# Patient Record
Sex: Male | Born: 1975 | Race: Black or African American | Hispanic: No | Marital: Married | Smoking: Former smoker
Health system: Southern US, Community
[De-identification: ages and names within clinical notes are randomized; demographics above are authoritative.]

## PROBLEM LIST (undated history)

## (undated) DIAGNOSIS — R519 Headache, unspecified: Secondary | ICD-10-CM

## (undated) DIAGNOSIS — I10 Essential (primary) hypertension: Secondary | ICD-10-CM

## (undated) DIAGNOSIS — R51 Headache: Secondary | ICD-10-CM

## (undated) HISTORY — DX: Headache: R51

## (undated) HISTORY — PX: WRIST SURGERY: SHX841

## (undated) HISTORY — DX: Headache, unspecified: R51.9

---

## 1999-02-25 ENCOUNTER — Emergency Department (HOSPITAL_COMMUNITY): Admission: EM | Admit: 1999-02-25 | Discharge: 1999-02-25 | Payer: Self-pay | Admitting: Emergency Medicine

## 1999-06-15 ENCOUNTER — Encounter: Payer: Self-pay | Admitting: Emergency Medicine

## 1999-06-15 ENCOUNTER — Emergency Department (HOSPITAL_COMMUNITY): Admission: EM | Admit: 1999-06-15 | Discharge: 1999-06-15 | Payer: Self-pay | Admitting: Emergency Medicine

## 1999-06-19 ENCOUNTER — Emergency Department (HOSPITAL_COMMUNITY): Admission: EM | Admit: 1999-06-19 | Discharge: 1999-06-19 | Payer: Self-pay | Admitting: Emergency Medicine

## 1999-06-19 ENCOUNTER — Encounter: Payer: Self-pay | Admitting: Emergency Medicine

## 1999-06-26 ENCOUNTER — Emergency Department (HOSPITAL_COMMUNITY): Admission: EM | Admit: 1999-06-26 | Discharge: 1999-06-26 | Payer: Self-pay | Admitting: Emergency Medicine

## 2001-04-11 ENCOUNTER — Encounter: Payer: Self-pay | Admitting: Emergency Medicine

## 2001-04-11 ENCOUNTER — Emergency Department (HOSPITAL_COMMUNITY): Admission: EM | Admit: 2001-04-11 | Discharge: 2001-04-11 | Payer: Self-pay | Admitting: Emergency Medicine

## 2001-08-11 ENCOUNTER — Encounter: Payer: Self-pay | Admitting: Emergency Medicine

## 2001-08-11 ENCOUNTER — Emergency Department (HOSPITAL_COMMUNITY): Admission: EM | Admit: 2001-08-11 | Discharge: 2001-08-11 | Payer: Self-pay | Admitting: Emergency Medicine

## 2001-08-29 ENCOUNTER — Emergency Department (HOSPITAL_COMMUNITY): Admission: EM | Admit: 2001-08-29 | Discharge: 2001-08-29 | Payer: Self-pay | Admitting: Emergency Medicine

## 2001-08-29 ENCOUNTER — Encounter: Payer: Self-pay | Admitting: Emergency Medicine

## 2002-01-11 ENCOUNTER — Emergency Department (HOSPITAL_COMMUNITY): Admission: AC | Admit: 2002-01-11 | Discharge: 2002-01-11 | Payer: Self-pay

## 2002-01-11 ENCOUNTER — Encounter: Payer: Self-pay | Admitting: Emergency Medicine

## 2005-03-23 ENCOUNTER — Emergency Department (HOSPITAL_COMMUNITY): Admission: EM | Admit: 2005-03-23 | Discharge: 2005-03-23 | Payer: Self-pay | Admitting: Emergency Medicine

## 2005-04-26 ENCOUNTER — Emergency Department (HOSPITAL_COMMUNITY): Admission: EM | Admit: 2005-04-26 | Discharge: 2005-04-26 | Payer: Self-pay | Admitting: Emergency Medicine

## 2006-04-11 ENCOUNTER — Emergency Department (HOSPITAL_COMMUNITY): Admission: EM | Admit: 2006-04-11 | Discharge: 2006-04-11 | Payer: Self-pay | Admitting: Emergency Medicine

## 2006-04-17 ENCOUNTER — Ambulatory Visit (HOSPITAL_COMMUNITY): Admission: RE | Admit: 2006-04-17 | Discharge: 2006-04-18 | Payer: Self-pay | Admitting: Orthopaedic Surgery

## 2006-05-24 ENCOUNTER — Encounter: Admission: RE | Admit: 2006-05-24 | Discharge: 2006-08-22 | Payer: Self-pay | Admitting: Orthopaedic Surgery

## 2007-01-21 ENCOUNTER — Ambulatory Visit: Payer: Self-pay | Admitting: Internal Medicine

## 2007-02-13 ENCOUNTER — Ambulatory Visit: Payer: Self-pay | Admitting: Internal Medicine

## 2007-02-13 ENCOUNTER — Encounter (INDEPENDENT_AMBULATORY_CARE_PROVIDER_SITE_OTHER): Payer: Self-pay | Admitting: *Deleted

## 2007-03-09 ENCOUNTER — Emergency Department (HOSPITAL_COMMUNITY): Admission: EM | Admit: 2007-03-09 | Discharge: 2007-03-09 | Payer: Self-pay | Admitting: Emergency Medicine
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
## 2007-08-18 ENCOUNTER — Emergency Department (HOSPITAL_COMMUNITY): Admission: EM | Admit: 2007-08-18 | Discharge: 2007-08-18 | Payer: Self-pay | Admitting: Emergency Medicine

## 2008-01-16 ENCOUNTER — Emergency Department (HOSPITAL_COMMUNITY): Admission: EM | Admit: 2008-01-16 | Discharge: 2008-01-17 | Payer: Self-pay | Admitting: Emergency Medicine

## 2008-05-15 ENCOUNTER — Emergency Department (HOSPITAL_COMMUNITY): Admission: EM | Admit: 2008-05-15 | Discharge: 2008-05-15 | Payer: Self-pay | Admitting: Emergency Medicine

## 2008-10-11 ENCOUNTER — Encounter: Admission: RE | Admit: 2008-10-11 | Discharge: 2008-10-11 | Payer: Self-pay | Admitting: Family Medicine

## 2010-04-04 ENCOUNTER — Emergency Department (HOSPITAL_COMMUNITY): Admission: EM | Admit: 2010-04-04 | Discharge: 2010-04-04 | Payer: Self-pay | Admitting: Emergency Medicine

## 2010-06-13 ENCOUNTER — Emergency Department (HOSPITAL_COMMUNITY): Admission: EM | Admit: 2010-06-13 | Discharge: 2010-06-13 | Payer: Self-pay | Admitting: Emergency Medicine

## 2011-02-01 ENCOUNTER — Encounter (HOSPITAL_COMMUNITY): Payer: Self-pay | Admitting: Radiology

## 2011-02-01 ENCOUNTER — Inpatient Hospital Stay (HOSPITAL_COMMUNITY)
Admission: EM | Admit: 2011-02-01 | Discharge: 2011-02-02 | DRG: 392 | Disposition: A | Discharge status: Home / Self Care | Payer: Self-pay | Attending: Internal Medicine | Admitting: Internal Medicine

## 2011-02-01 ENCOUNTER — Emergency Department (HOSPITAL_COMMUNITY): Payer: Self-pay

## 2011-02-01 DIAGNOSIS — A09 Infectious gastroenteritis and colitis, unspecified: Principal | ICD-10-CM | POA: Diagnosis present

## 2011-02-01 DIAGNOSIS — G43909 Migraine, unspecified, not intractable, without status migrainosus: Secondary | ICD-10-CM | POA: Diagnosis present

## 2011-02-01 DIAGNOSIS — R197 Diarrhea, unspecified: Secondary | ICD-10-CM

## 2011-02-01 DIAGNOSIS — F1210 Cannabis abuse, uncomplicated: Secondary | ICD-10-CM | POA: Diagnosis present

## 2011-02-01 DIAGNOSIS — R1031 Right lower quadrant pain: Secondary | ICD-10-CM

## 2011-02-01 DIAGNOSIS — K219 Gastro-esophageal reflux disease without esophagitis: Secondary | ICD-10-CM | POA: Diagnosis present

## 2011-02-01 DIAGNOSIS — F17200 Nicotine dependence, unspecified, uncomplicated: Secondary | ICD-10-CM | POA: Diagnosis present

## 2011-02-01 DIAGNOSIS — R933 Abnormal findings on diagnostic imaging of other parts of digestive tract: Secondary | ICD-10-CM

## 2011-02-01 LAB — COMPREHENSIVE METABOLIC PANEL
ALT: 23 U/L (ref 0–53)
Albumin: 3.8 g/dL (ref 3.5–5.2)
Alkaline Phosphatase: 80 U/L (ref 39–117)
BUN: 6 mg/dL (ref 6–23)
CO2: 25 mEq/L (ref 19–32)
GFR calc Af Amer: >60 mL/min (ref >60)
Glucose, Bld: 88 mg/dL (ref 70–99)
Sodium: 140 mEq/L (ref 135–145)

## 2011-02-01 LAB — DIFFERENTIAL
Basophils Absolute: 0 10*3/uL (ref 0.0–0.1)
Eosinophils Absolute: 0.2 10*3/uL (ref 0.0–0.7)
Eosinophils Relative: 2 % (ref 0–5)
Lymphs Abs: 2.8 10*3/uL (ref 0.7–4.0)
Monocytes Absolute: 0.8 10*3/uL (ref 0.1–1.0)
Monocytes Relative: 9 % (ref 3–12)
Neutrophils Relative %: 56 % (ref 43–77)

## 2011-02-01 LAB — CBC
HCT: 41 % (ref 39.0–52.0)
Hemoglobin: 13.9 g/dL (ref 13.0–17.0)
MCHC: 33.9 g/dL (ref 30.0–36.0)
Platelets: 241 10*3/uL (ref 150–400)
RBC: 4.42 MIL/uL (ref 4.22–5.81)
WBC: 8.5 10*3/uL (ref 4.0–10.5)

## 2011-02-01 LAB — URINALYSIS, ROUTINE W REFLEX MICROSCOPIC
Bilirubin Urine: NEGATIVE
Glucose, UA: NEGATIVE mg/dL
Ketones, ur: NEGATIVE mg/dL
Leukocytes, UA: NEGATIVE
Protein, ur: NEGATIVE mg/dL
Protein, ur: NEGATIVE mg/dL
Specific Gravity, Urine: 1.012 (ref 1.005–1.030)
Urobilinogen, UA: 0.2 mg/dL (ref 0.0–1.0)

## 2011-02-01 LAB — MRSA PCR SCREENING: MRSA by PCR: NEGATIVE

## 2011-02-01 LAB — URINE MICROSCOPIC-ADD ON

## 2011-02-01 MED ORDER — IOHEXOL 300 MG/ML  SOLN
100.0000 mL | Freq: Once | INTRAMUSCULAR | Status: AC | PRN
  Administered 2011-02-01: 100 mL via INTRAVENOUS

## 2011-02-02 LAB — COMPREHENSIVE METABOLIC PANEL
ALT: 20 U/L (ref 0–53)
Alkaline Phosphatase: 69 U/L (ref 39–117)
Chloride: 108 mEq/L (ref 96–112)
GFR calc Af Amer: >60 mL/min (ref >60)
GFR calc non Af Amer: >60 mL/min (ref >60)
Glucose, Bld: 134 mg/dL — ABNORMAL HIGH (ref 70–99)
Potassium: 4.5 mEq/L (ref 3.5–5.1)
Total Bilirubin: 0.5 mg/dL (ref 0.3–1.2)

## 2011-02-02 LAB — CBC
HCT: 40.8 % (ref 39.0–52.0)
MCH: 31.5 pg (ref 26.0–34.0)
MCHC: 33.8 g/dL (ref 30.0–36.0)
MCV: 93.2 fL (ref 78.0–100.0)
Platelets: 257 10*3/uL (ref 150–400)
RDW: 13 % (ref 11.5–15.5)
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
## 2011-03-03 NOTE — Discharge Summary (Signed)
Dylan Mccullough, Dylan Mccullough                    ACCOUNT NO.:  [id number]  MEDICAL RECORD NO.:  [id number]           PATIENT TYPE:  I  LOCATION:  1507                         FACILITY:  [facility]  PHYSICIAN:  Kathlen Mody, MD       DATE OF BIRTH:  Jun 06, 1976  DATE OF ADMISSION:  02/01/2011 DATE OF DISCHARGE:  02/02/2011                              DISCHARGE SUMMARY   DISCHARGE DIAGNOSES: 1. Gastroenteritis. 2. Possible inflammatory bowel disease. 3. Gastroesophageal reflux disease. 4. Migraine. 5. Marijuana abuse.  DISCHARGE MEDICATIONS: 1. Tylenol 650 mg every 4 hours as needed. 2. Ciprofloxacin 500 mg twice daily for 7 days. 3. Flagyl 250 mg 3 times a day to take for 7 days. 4. Nicotine patch daily. 5. Zofran 4 mg q.6 hours as needed. 6. Protonix 40 mg 1 tab daily. 7. Multivitamin 1 tab twice daily.  CONSULTATIONS CALLED:  Gastroenterology consult from [facility].  PROCEDURES DONE:  None.  PERTINENT LABORATORY DATA:  On admission, the patient a CBC done which was within normal limits, comprehensive metabolic panel which was also within normal limits.  Urinalysis was negative for nitrites and leukocytes.  An MRSA screening was negative.  Stool occult was negative.  RADIOLOGY:  The patient had a CT abdomen and pelvis with contrast, showed marked mucosal edema of the distal and terminal ileum, consistent with IBD, probably Crohn disease.  No complicated features are evident.  BRIEF HOSPITAL COURSE:  This 35 year old male with a history of tobacco and marijuana abuse, history of GERD, admitted with abdominal pain and increased bowel movements and a little bit of nausea and vomiting.  The patient, on coming to the ER, had a CT abdomen and pelvis done, which showed marked mucosal edema in the distal part of the ileum, consistent with IBD, at which point he was started on IV antibiotics, IV Cipro and IV Flagyl.  He was also started on IV fluids.  Gastroenterology consult was called.  The  patient was previously seen by [facility] and [facility] was consulted who recommended advancing the diet as tolerated and felt that the patient's symptoms do not look like consistent with IBD. Over the next 24 hours, the patient's symptoms have improved dramatically.  The next day, the patient did not have any nausea, vomiting, abdominal pain, or diarrhea.  He insisted he wanted to go home.  Spoke with Gastroenterology who recommended changing the antibiotics to p.o. antibiotics and advancing his diet to regular diet. The patient tolerated regular diet over the next few hours.  The patient was discharged on p.o. antibiotics for about 1 week.  PHYSICAL EXAMINATION:  VITAL SIGNS:  On the day of discharge, the patient's vitals improved, temperature of 97.9, pulse of 78, respirations 17, blood pressure of 134/75, and saturating 93% on room air. GENERAL:  On exam, he is alert, afebrile, comfortable, oriented x3. CARDIOVASCULAR:  S1, S2 heard. RESPIRATORY:  Good air entry bilaterally. ABDOMEN:  Soft, nontender, nondistended.  Bowel sounds are heard. EXTREMITIES:  No pedal edema.  The patient was hemodynamically stable for discharge and he was discharged on 7 days of  p.o. antibiotics for possible gastroenteritis as the patient's wife had similar complaints about few days ago.  FOLLOWUP:  The patient is recommended to follow up with PCP in about 1 week and to follow up with [facility] in about 1-2 weeks for further workup.          ______________________________ Kathlen Mody, MD     VA/MEDQ  D:  03/01/2011  T:  03/02/2011  Job:  [id number]  Electronically Signed by Kathlen Mody MD on 03/03/2011 02:17:19 PM

## 2011-03-30 NOTE — Assessment & Plan Note (Signed)
[facility]                         GASTROENTEROLOGY OFFICE NOTE   YASSER, HEPP                           MRN:          [id number]  DATE:01/21/2007                            DOB:          04-07-1976    Mr. Rieger is a very nice 35 year old gentleman who comes with severe  heartburn of 3-4 years duration. He is a single parent having 3 children  and working second shift from 5 to midnight. He often comes home at  12:00 and eats a large meal, eats between 12 and 1:00 in the morning and  he goes directly to bed and usually wake up at 3-6 in the morning with  severe heartburn. He denies any dysphagia, nocturnal cough or  hoarseness. He takes Tums and Rolaids but has not tried any proton pump  inhibitor. He continues to smoke but does not drink any alcohol or  caffeine. He denies taking NSAIDs or aspirin. He has had some  intentional weight loss from 255 to 230 pounds. He has been eating out a  lot being rather stressed out.   MEDICATIONS:  None.   PAST MEDICAL HISTORY:  Significant for wrist surgery, no other  operations. No family history of colon cancer.   FAMILY HISTORY:  Significant for heart disease in his mother who had a  coronary artery bypass graft.   SOCIAL HISTORY:  Single with 4 children. He works as a Financial risk analyst. He smokes  and drinks alcohol socially.   REVIEW OF SYSTEMS:  Positive for skin rash, back pain, heart problems.   PHYSICAL EXAMINATION:  VITAL SIGNS:  Blood pressure 140/76, pulse 100  and weight 232 pounds.  GENERAL:  He was alert and oriented in no distress.  LUNGS:  Clear to auscultation.  COR:  Normal S1, normal S2.  ABDOMEN:  Soft, nontender with normal active bowel sounds.  EXTREMITIES:  No edema.   IMPRESSION:  A 35 year old African-American male with severe heartburn  of 3-4 years duration most likely aggravated by his lifestyle, working  late at night and eating late. Also smoking may be a contributing  factor. Rule  out Barrett's esophagus. Clinically he does not have any  dysphagia or signs of aspiration.   PLAN:  1. Booklet on gastroesophageal reflux which includes head of the bed      elevation, modifying      his eating habits and lifestyle.  2. Upper endoscopy scheduled.  3. Nexium 40 mg daily. He takes 2 today and then 1 at 8 p.m. every      day.     Hedwig Morton. Juanda Chance, MD  Electronically Signed    DMB/MedQ  DD: 01/21/2007  DT: 01/23/2007  Job #: [id number]
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
## 2011-08-23 LAB — GC/CHLAMYDIA PROBE AMP, GENITAL: Chlamydia, DNA Probe: NEGATIVE

## 2011-09-13 ENCOUNTER — Emergency Department (HOSPITAL_COMMUNITY)
Admission: EM | Admit: 2011-09-13 | Discharge: 2011-09-14 | Disposition: A | Discharge status: Home / Self Care | Payer: Self-pay | Attending: Emergency Medicine | Admitting: Emergency Medicine

## 2011-09-13 DIAGNOSIS — K625 Hemorrhage of anus and rectum: Secondary | ICD-10-CM | POA: Insufficient documentation

## 2011-09-13 LAB — POCT I-STAT, CHEM 8
Calcium, Ion: 1.15 mmol/L (ref 1.12–1.32)
Chloride: 108 mEq/L (ref 96–112)
Creatinine, Ser: 0.9 mg/dL (ref 0.50–1.35)
Glucose, Bld: 105 mg/dL — ABNORMAL HIGH (ref 70–99)
HCT: 39 % (ref 39.0–52.0)
Sodium: 141 mEq/L (ref 135–145)

## 2011-09-13 LAB — OCCULT BLOOD, POC DEVICE: Fecal Occult Bld: POSITIVE

## 2012-07-02 ENCOUNTER — Encounter (HOSPITAL_COMMUNITY): Payer: Self-pay | Admitting: *Deleted

## 2012-07-02 ENCOUNTER — Emergency Department (HOSPITAL_COMMUNITY)
Admission: EM | Admit: 2012-07-02 | Discharge: 2012-07-02 | Disposition: A | Discharge status: Home / Self Care | Payer: Self-pay | Attending: Emergency Medicine | Admitting: Emergency Medicine

## 2012-07-02 DIAGNOSIS — Z2089 Contact with and (suspected) exposure to other communicable diseases: Secondary | ICD-10-CM | POA: Insufficient documentation

## 2012-07-02 MED ORDER — PERMETHRIN 5 % EX CREA: TOPICAL_CREAM | CUTANEOUS | Status: AC

## 2012-07-02 NOTE — ED Provider Notes (Addendum)
History     CSN: [id number]  Arrival date & time 07/02/12  1800   First MD Initiated Contact with Patient 07/02/12 1803      Chief Complaint  Patient presents with  . scabies exposure     (Consider location/radiation/quality/duration/timing/severity/associated sxs/prior treatment) HPI Comments: Patient presents complaining of scabies exposure. Exposure was from a friend's child who was staying in his home. He as well as his wife and 3 children have been itching. He does not have a rash and has no other complaints. No pain or other areas of redness or drainage.  The history is provided by the patient.    History reviewed. No pertinent past medical history.  History reviewed. No pertinent past surgical history.  No family history on file.  History  Substance Use Topics  . Smoking status: Not on file  . Smokeless tobacco: Not on file  . Alcohol Use: Not on file      Review of Systems  All other systems reviewed and are negative.    Allergies  Review of patient's allergies indicates no known allergies.  Home Medications  No current outpatient prescriptions on file.  BP 146/94  Pulse 90  Temp 98.6 F (37 C) (Oral)  SpO2 100%  Physical Exam  Constitutional: He is oriented to person, place, and time. He appears well-developed and well-nourished. No distress.  HENT:  Head: Atraumatic.  Eyes: Pupils are equal, round, and reactive to light.  Musculoskeletal: He exhibits no edema and no tenderness.  Neurological: He is alert and oriented to person, place, and time.  Skin: Skin is warm and dry. No rash noted. No erythema.    ED Course  Procedures (including critical care time)  Labs Reviewed - No data to display No results found.   1. Scabies exposure       MDM           Gwyneth Sprout, MD 07/02/12 4098  Gwyneth Sprout, MD 07/02/12 1816

## 2012-07-02 NOTE — ED Notes (Signed)
Here for scabies exposure

## 2013-09-27 ENCOUNTER — Emergency Department (HOSPITAL_COMMUNITY): Payer: No Typology Code available for payment source

## 2013-09-27 ENCOUNTER — Encounter (HOSPITAL_COMMUNITY): Payer: Self-pay | Admitting: Emergency Medicine

## 2013-09-27 ENCOUNTER — Emergency Department (HOSPITAL_COMMUNITY)
Admission: EM | Admit: 2013-09-27 | Discharge: 2013-09-27 | Disposition: A | Discharge status: Home / Self Care | Payer: No Typology Code available for payment source | Attending: Emergency Medicine | Admitting: Emergency Medicine

## 2013-09-27 DIAGNOSIS — S62308A Unspecified fracture of other metacarpal bone, initial encounter for closed fracture: Secondary | ICD-10-CM

## 2013-09-27 DIAGNOSIS — IMO0002 Reserved for concepts with insufficient information to code with codable children: Secondary | ICD-10-CM | POA: Insufficient documentation

## 2013-09-27 DIAGNOSIS — S62319A Displaced fracture of base of unspecified metacarpal bone, initial encounter for closed fracture: Secondary | ICD-10-CM | POA: Insufficient documentation

## 2013-09-27 DIAGNOSIS — Y939 Activity, unspecified: Secondary | ICD-10-CM | POA: Insufficient documentation

## 2013-09-27 DIAGNOSIS — Z87891 Personal history of nicotine dependence: Secondary | ICD-10-CM | POA: Insufficient documentation

## 2013-09-27 DIAGNOSIS — Y929 Unspecified place or not applicable: Secondary | ICD-10-CM | POA: Insufficient documentation

## 2013-09-27 MED ORDER — HYDROCODONE-ACETAMINOPHEN 5-325 MG PO TABS: 1.0000 | ORAL_TABLET | ORAL | Status: DC | PRN

## 2013-09-27 MED ORDER — HYDROCODONE-ACETAMINOPHEN 5-325 MG PO TABS
1.0000 | ORAL_TABLET | Freq: Once | ORAL | Status: AC
  Administered 2013-09-27: 1 via ORAL
  Filled 2013-09-27: qty 1

## 2013-09-27 NOTE — ED Notes (Signed)
Pt given d/c instructions and verbalized understanding. NAD at this time. Pts wife is driving home.

## 2013-09-27 NOTE — Progress Notes (Signed)
Orthopedic Tech Progress Note Patient Details:  Dylan Mccullough Apr 03, 1976 [id number]  Ortho Devices Type of Ortho Device: Ace wrap;Arm sling;Ulna gutter splint Ortho Device/Splint Location: RUE Ortho Device/Splint Interventions: Ordered;Application   Jennye Moccasin 09/27/2013, 8:45 PM

## 2013-09-27 NOTE — ED Provider Notes (Signed)
CSN: [id number]     Arrival date & time 09/27/13  1859 History   First MD Initiated Contact with Patient 09/27/13 1911     This chart was scribed for non-physician practitioner, Teressa Lower, NP working with Hilario Quarry, MD by Arlan Organ, ED Scribe. This patient was seen in room TR08C/TR08C and the patient's care was started at 7:41 PM.   Chief Complaint  Patient presents with  . Hand Injury   The history is provided by the patient. No language interpreter was used.   HPI Comments: Dylan Mccullough is a 37 y.o. male who presents to the Emergency Department complaining of a right hand injury that occurred earlier today. Pt states he jammed his finger when hitting a wall. He also reports associated swelling to his right knuckle. He says he has not taken anything OTC for discomfort. Pt states he ate just prior to arrival. He denies any known allergies.  History reviewed. No pertinent past medical history. Past Surgical History  Procedure Laterality Date  . Wrist surgery      right   History reviewed. No pertinent family history. History  Substance Use Topics  . Smoking status: Former Games developer  . Smokeless tobacco: Not on file  . Alcohol Use: No    Review of Systems  Musculoskeletal: Positive for arthralgias (hand injury) and joint swelling.  All other systems reviewed and are negative.    Allergies  Review of patient's allergies indicates no known allergies.  Home Medications  No current outpatient prescriptions on file.  Triage Vitals: BP 155/98  Pulse 97  Temp(Src) 98.7 F (37.1 C) (Oral)  Resp 16  Ht 5\' 11"  (1.803 m)  Wt 225 lb (102.059 kg)  BMI 31.39 kg/m2  SpO2 99%  Physical Exam  Nursing note and vitals reviewed. Constitutional: He is oriented to person, place, and time. He appears well-developed and well-nourished.  HENT:  Head: Normocephalic and atraumatic.  Eyes: EOM are normal.  Neck: Normal range of motion.  Cardiovascular: Normal rate.    Pulmonary/Chest: Effort normal.  Musculoskeletal: Normal range of motion. He exhibits edema and tenderness.  Obvious swelling to area of 4th metacarpal of the right hand:cap refill <3sec  Neurological: He is alert and oriented to person, place, and time.  Skin: Skin is warm and dry.  Psychiatric: He has a normal mood and affect. His behavior is normal.    ED Course  Procedures (including critical care time)  DIAGNOSTIC STUDIES: Oxygen Saturation is 99% on RA, Normal by my interpretation.    COORDINATION OF CARE: 7:45 PM- Will order X-Ray and splint. Discussed treatment plan with pt at bedside and pt agreed to plan.     Labs Review Labs Reviewed - No data to display Imaging Review Dg Hand Complete Right  09/27/2013   CLINICAL DATA:  Punched a wall today with obvious swelling involving the medial aspect of the right hand, history of prior wrist fracture  EXAM: RIGHT HAND - COMPLETE 3+ VIEW  COMPARISON:  None.  FINDINGS: There is a slightly comminuted and impacted fracture involving the head of the 4th metacarpal, apex ventral with associated adjacent soft tissue swelling. No definite intra-articular extension. No dislocation.  Post ORIF of the distal radius which is incompletely evaluated though without definitive evidence of hardware failure or loosening. Advanced degenerative changes of the radiocarpal joint is are suspected. Joint spaces are preserved. No erosions.  IMPRESSION: 1. Acute slightly comminuted and impacted fracture of the head of the 4th metacarpal without  definitive intra-articular extension. 2. Sequela of prior ORIF of the distal radius with associated advanced degenerative changes of the radiocarpal joint, incompletely evaluated.   Electronically Signed   By: Simonne Come M.D.   On: 09/27/2013 20:01    EKG Interpretation   None       MDM   1. Fracture of fourth metacarpal bone, closed, initial encounter    Pt is okay to follow up with DR. Magnus Ivan who has been  seen in the past:pt unable to full extend finger:neurovascularly intact  I personally performed the services described in this documentation, which was scribed in my presence. The recorded information has been reviewed and is accurate.    Teressa Lower, NP 09/27/13 2052

## 2013-09-27 NOTE — ED Notes (Signed)
Pt states that he jammed his finger this evening. Pt has a swollen knuckle in his right hand below his third finger. Pt able to wiggle finger and cns intact.

## 2013-09-27 NOTE — ED Provider Notes (Signed)
History/physical exam/procedure(s) were performed by non-physician practitioner and as supervising physician I was immediately available for consultation/collaboration. I have reviewed all notes and am in agreement with care and plan.   Dorethy Tomey S Sundai Probert, MD 09/27/13 2247 

## 2015-01-23 ENCOUNTER — Encounter (HOSPITAL_COMMUNITY): Payer: Self-pay | Admitting: Emergency Medicine

## 2015-01-23 ENCOUNTER — Emergency Department (HOSPITAL_COMMUNITY)
Admission: EM | Admit: 2015-01-23 | Discharge: 2015-01-23 | Disposition: A | Discharge status: Home / Self Care | Payer: BLUE CROSS/BLUE SHIELD | Attending: Emergency Medicine | Admitting: Emergency Medicine

## 2015-01-23 DIAGNOSIS — R519 Headache, unspecified: Secondary | ICD-10-CM

## 2015-01-23 DIAGNOSIS — R51 Headache: Secondary | ICD-10-CM | POA: Diagnosis not present

## 2015-01-23 DIAGNOSIS — I10 Essential (primary) hypertension: Secondary | ICD-10-CM | POA: Insufficient documentation

## 2015-01-23 DIAGNOSIS — Z720 Tobacco use: Secondary | ICD-10-CM | POA: Insufficient documentation

## 2015-01-23 DIAGNOSIS — H53149 Visual discomfort, unspecified: Secondary | ICD-10-CM | POA: Insufficient documentation

## 2015-01-23 HISTORY — DX: Essential (primary) hypertension: I10

## 2015-01-23 LAB — CBC
HEMATOCRIT: 39.2 % (ref 39.0–52.0)
Hemoglobin: 12.9 g/dL — ABNORMAL LOW (ref 13.0–17.0)
MCH: 30.2 pg (ref 26.0–34.0)
MCHC: 32.9 g/dL (ref 30.0–36.0)
MCV: 91.8 fL (ref 78.0–100.0)
Platelets: 281 10*3/uL (ref 150–400)
RBC: 4.27 MIL/uL (ref 4.22–5.81)
RDW: 14.7 % (ref 11.5–15.5)
WBC: 8.9 10*3/uL (ref 4.0–10.5)

## 2015-01-23 LAB — I-STAT CHEM 8, ED
BUN: 13 mg/dL (ref 6–23)
Calcium, Ion: 1.09 mmol/L — ABNORMAL LOW (ref 1.12–1.23)
Chloride: 105 mmol/L (ref 96–112)
Creatinine, Ser: 1 mg/dL (ref 0.50–1.35)
Glucose, Bld: 116 mg/dL — ABNORMAL HIGH (ref 70–99)
HCT: 43 % (ref 39.0–52.0)
Hemoglobin: 14.6 g/dL (ref 13.0–17.0)
Potassium: 3.8 mmol/L (ref 3.5–5.1)
SODIUM: 140 mmol/L (ref 135–145)
TCO2: 20 mmol/L (ref 0–100)

## 2015-01-23 MED ORDER — DIPHENHYDRAMINE HCL 50 MG/ML IJ SOLN: 25.0000 mg | Freq: Once | INTRAMUSCULAR | Status: DC

## 2015-01-23 MED ORDER — KETOROLAC TROMETHAMINE 30 MG/ML IJ SOLN: 30.0000 mg | Freq: Once | INTRAMUSCULAR | Status: DC

## 2015-01-23 MED ORDER — METOCLOPRAMIDE HCL 5 MG/ML IJ SOLN: 10.0000 mg | Freq: Once | INTRAMUSCULAR | Status: DC

## 2015-01-23 MED ORDER — SODIUM CHLORIDE 0.9 % IV BOLUS (SEPSIS): 1000.0000 mL | Freq: Once | INTRAVENOUS | Status: DC

## 2015-01-23 NOTE — ED Notes (Signed)
Wheel chair at bedside.

## 2015-01-23 NOTE — ED Notes (Signed)
Patient presents with a HA 7/10 right sided. States the pain has been ongoing for four weeks. He has seen a PCP for this and has a follow up appointment in two weeks. States he was prescribed HTN medication and the pain became too much and needs pain treatment.

## 2015-01-23 NOTE — ED Provider Notes (Signed)
CSN: [id number]     Arrival date & time 01/23/15  0351 History   First MD Initiated Contact with Patient 01/23/15 [id number]     Chief Complaint  Patient presents with  . Headache     (Consider location/radiation/quality/duration/timing/severity/associated sxs/prior Treatment) HPI  Dylan Mccullough is a 39 y.o. male with PMH of particular, headaches presenting with intermittent headache for the last 4 weeks. He describes the pressure on the right side actually behind the eyes. Patient presented to his primary care provider and was given beta blocker and states he has not had any improvement. Has not taken anything else for his symptoms. He endorses associated photophobia but no nausea or vomiting. No visual changes, slurred speech, numbness, tingling, weakness. No fevers chills or neck pain. He states is like other headaches he's had before with the last one in 2010. He does endorse lacrimation salivation.   Past Medical History  Diagnosis Date  . Hypertension    Past Surgical History  Procedure Laterality Date  . Wrist surgery      right   History reviewed. No pertinent family history. History  Substance Use Topics  . Smoking status: Current Some Day Smoker  . Smokeless tobacco: Not on file  . Alcohol Use: No    Review of Systems 10 Systems reviewed and are negative for acute change except as noted in the HPI.    Allergies  Review of patient's allergies indicates no known allergies.  Home Medications   Prior to Admission medications   Medication Sig Start Date End Date Taking? Authorizing Provider  HYDROcodone-acetaminophen (NORCO/VICODIN) 5-325 MG per tablet Take 1-2 tablets by mouth every 4 (four) hours as needed. Patient not taking: Reported on 01/23/2015 09/27/13   Teressa Lower, NP   BP 162/88 mmHg  Pulse 83  Temp(Src) 98.4 F (36.9 C) (Oral)  Resp 22  Ht 6' (1.829 m)  Wt 245 lb (111.131 kg)  BMI 33.22 kg/m2  SpO2 99% Physical Exam  Constitutional: He appears  well-developed and well-nourished. No distress.  HENT:  Head: Normocephalic and atraumatic.  Mouth/Throat: Oropharynx is clear and moist.  Eyes: Conjunctivae and EOM are normal. Pupils are equal, round, and reactive to light. Right eye exhibits no discharge. Left eye exhibits no discharge.  Neck: Normal range of motion. Neck supple.  No nuchal rigidity  Cardiovascular: Normal rate and regular rhythm.   Pulmonary/Chest: Effort normal and breath sounds normal. No respiratory distress. He has no wheezes.  Abdominal: Soft. Bowel sounds are normal. He exhibits no distension. There is no tenderness.  Neurological: He is alert. No cranial nerve deficit. Coordination normal.  Speech is clear and goal oriented. Peripheral visual fields intact. Strength 5/5 in upper and lower extremities. Sensation intact. Intact rapid alternating movements, finger to nose, and heel to shin. Negative Romberg. No pronator drift. Normal gait.   Skin: Skin is warm and dry. He is not diaphoretic.  Nursing note and vitals reviewed.   ED Course  Procedures (including critical care time) Labs Review Labs Reviewed  CBC - Abnormal; Notable for the following:    Hemoglobin 12.9 (*)    All other components within normal limits  I-STAT CHEM 8, ED - Abnormal; Notable for the following:    Glucose, Bld 116 (*)    Calcium, Ion 1.09 (*)    All other components within normal limits    Imaging Review No results found.   EKG Interpretation None      MDM   Final diagnoses:  Nonintractable  headache   Patient presenting with intermittent headaches for 4 weeks and has been headache free since 2010 with headaches were much worse but similar. No visual changes, nausea or vomiting or slurred speech. VSS. Neurological exam without deficits. I doubt subarachnoid, intracranial, meningitis. Patient with possible cluster headaches and has been referred to neurology for further evaluation. He is also to follow-up with  PCP.  Discussed return precautions with patient. Discussed all results and patient verbalizes understanding and agrees with plan.   Dylan ConroyVictoria Evelin Cake, PA-C 01/23/15 [id number]  Loren Raceravid Yelverton, MD 01/25/15 [id number]

## 2015-01-23 NOTE — ED Notes (Addendum)
Patient with headache on and off for the last 4 weeks.  Patient has been started on Bystolic, patient continuing with headache and HTN.  Patient states that he does have cold chills but no nausea or vomiting.  Patient does have some photophobia.

## 2015-01-23 NOTE — ED Notes (Signed)
MD at bedside. 

## 2015-01-23 NOTE — Discharge Instructions (Signed)
Return to the emergency room with worsening of symptoms, new symptoms or with symptoms that are concerning , especially severe worsening of headache, visual or speech changes, weakness in face, arms or legs. Ibuprofen 400mg  (2 tablets 200mg ) every 5-6 hours for 3-5 days. Call to make appointment as soon as possible with neurology. Number provided above. Follow up with your primary care provider in 2 weeks as scheduled. Read below information and follow recommendations. Cluster Headache Cluster headaches are recognized by their pattern of deep, intense head pain. They normally occur on one side of your head, but they may "switch sides" in subsequent episodes. Typically, cluster headaches:   Are severe in nature.   Occur repeatedly over weeks to months and are followed by periods of no headaches.   Can last from 15 minutes to 3 hours.   Occur at the same time each day, often at night.   Occur several times a day. CAUSES The exact cause of cluster headaches is not known. Alcohol use may be associated with cluster headaches. SIGNS AND SYMPTOMS   Severe pain that begins in or around your eye or temple.   One-sided head pain.   Feeling sick to your stomach (nauseous).   Sensitivity to light.   Runny nose.   Eye redness, tearing, and nasal stuffiness on the side of your head where you are experiencing pain.   Sweaty, pale skin of the face.   Droopy or swollen eyelid.   Restlessness. DIAGNOSIS  Cluster headaches are diagnosed based on symptoms and a physical exam. Your health care provider may order a CT scan or an MRI of your head or lab tests to see if your headaches are caused by other medical conditions.  TREATMENT   Medicines for pain relief and to prevent recurrent attacks. Some people may need a combination of medicines.  Oxygen for pain relief.   Biofeedback programs to help reduce headache pain.  It may be helpful to keep a headache diary. This may help  you find a trend for what is triggering your headaches. Your health care provider can develop a treatment plan.  HOME CARE INSTRUCTIONS  During cluster periods:   Follow a regular sleep schedule. Do not vary the amount and time that you sleep from day to day. It is important to stay on the same schedule during a cluster period to help prevent headaches.   Avoid alcohol.   Stop smoking if you smoke.  SEEK MEDICAL CARE IF:  You have any changes from your previous cluster headaches either in intensity or frequency.   You are not getting relief from medicines you are taking.  SEEK IMMEDIATE MEDICAL CARE IF:   You faint.   You have weakness or numbness, especially on one side of your body or face.   You have double vision.   You have nausea or vomiting that is not relieved within several hours.   You cannot keep your balance or have difficulty talking or walking.   You have neck pain or stiffness.   You have a fever. MAKE SURE YOU:  Understand these instructions.   Will watch your condition.   Will get help right away if you are not doing well or get worse. Document Released: 10/29/2005 Document Revised: 08/19/2013 Document Reviewed: 05/21/2013 [facility] Patient Information 2015 [place], [place]. This information is not intended to replace advice given to you by your health care provider. Make sure you discuss any questions you have with your health care provider.

## 2015-03-30 ENCOUNTER — Encounter: Payer: Self-pay | Admitting: Neurology

## 2015-03-30 ENCOUNTER — Ambulatory Visit (INDEPENDENT_AMBULATORY_CARE_PROVIDER_SITE_OTHER): Payer: BLUE CROSS/BLUE SHIELD | Admitting: Neurology

## 2015-03-30 VITALS — BP 135/87 | HR 75 | Temp 97.7°F | Ht 72.0 in | Wt 242.4 lb

## 2015-03-30 DIAGNOSIS — F1510 Other stimulant abuse, uncomplicated: Secondary | ICD-10-CM | POA: Diagnosis not present

## 2015-03-30 DIAGNOSIS — R251 Tremor, unspecified: Secondary | ICD-10-CM | POA: Diagnosis not present

## 2015-03-30 DIAGNOSIS — E785 Hyperlipidemia, unspecified: Secondary | ICD-10-CM | POA: Insufficient documentation

## 2015-03-30 DIAGNOSIS — I10 Essential (primary) hypertension: Secondary | ICD-10-CM | POA: Diagnosis not present

## 2015-03-30 DIAGNOSIS — E119 Type 2 diabetes mellitus without complications: Secondary | ICD-10-CM | POA: Diagnosis not present

## 2015-03-30 NOTE — Patient Instructions (Signed)
Overall you are doing fairly well but I do want to suggest a few things today:   Remember to drink plenty of fluid, eat healthy meals and do not skip any meals. Try to eat protein with a every meal and eat a healthy snack such as fruit or nuts in between meals. Try to keep a regular sleep-wake schedule and try to exercise daily, particularly in the form of walking, 20-30 minutes a day, if you can.   As far as your medications are concerned, I would like to suggest: Please completely stop caffeine for a week and evaluate tremor Let us know if alcohol improves tremor Return in 6 weeks  As far as diagnostic testing: Labs  I would like to see you back in 6 weeks, sooner if we need to. Please call us with any interim questions, concerns, problems, updates or refill requests.   Please also call us for any test results so we can go over those with you on the phone.  My clinical assistant and will answer any of your questions and relay your messages to me and also relay most of my messages to you.   Our phone number is [phone]. We also have an after hours call service for urgent matters and there is a physician on-call for urgent questions. For any emergencies you know to call 911 or go to the nearest emergency room

## 2015-03-30 NOTE — Progress Notes (Signed)
[facility]    Provider:  Dr Lucia GaskinsAhern Referring Provider: Renaye RakersBland, Veita, MD Primary Care Physician:  Geraldo PitterBLAND,VEITA J, MD  CC:  Facial tremors  HPI:  Dylan Mccullough is a [age] y.o. male here as a referral from Dr. Parke SimmersBland for tremors. PMHx HTN, HgbA1c, HLD. Started years ago. 6 years ago. Getting worse. Can't control it. It happens when he is trying to relax. Not positional. He doesn't notice it significantly, other people tell him about it. Doesn't bother him at all. Doesn't know if it happens when sleeping. He does notice it sometimes and feels it is more frequent. No tremors in the hands or legs. He drinks 2 Liters of soda and lots of tea tea, green tea with caffeine he drinks a lot of it. No stiff neck or decreased ROM. Tremor happens more when he is trying to focus. No family tremors as far as he knows. Unknown if alcohol helps. No headaches. No blurry vision.   Reviewed notes, labs and imaging from outside physicians, which showed: Patient was seen in the ED a few months agowith a 7 out of 10 right sided headache. At that time patient stated that the headache had been ongoing for 4 weeks. He had been to his primary care for the headache. At that time he says the pain was too great and he needed pain treatment. He describes the headache as pressure on the right side behind the eyes, he was given a beta blocker without improvement by his primary care. He reported photophobia but no nausea or vomiting, no visual changes no slurred speech, no numbness, no tingling, no weakness, no fevers chills or neck pain. Patient has had similar headaches in the past per report. The emergency room possibly patient had cluster headaches.   Review of Systems: Patient complains of symptoms per HPI as well as the following symptoms: Denies chest pain, shortness of breath. Pertinent negatives per HPI. All others negative.   History   Social History  . Marital Status: Married    Spouse Name: Dylan LaundrySonya  .  Number of Children: 8  . Years of Education: 9   Occupational History  . General Manger    Social History Main Topics  . Smoking status: Current Some Day Smoker -- 1.00 packs/day    Types: Cigarettes  . Smokeless tobacco: Not on file  . Alcohol Use: 0.0 oz/week    0 Standard drinks or equivalent per week     Comment: [place]  . Drug Use: No  . Sexual Activity: Not on file   Other Topics Concern  . Not on file   Social History Narrative   Lives at home with his wife and kids.   Caffeine use: Drinks 2 liters of soda per day.     Family History  Problem Relation Age of Onset  . Heart Problems Mother   . Parkinsonism Neg Hx   . Seizures Neg Hx     Past Medical History  Diagnosis Date  . Hypertension   . Headache     Past Surgical History  Procedure Laterality Date  . Wrist surgery      Right    Current Outpatient Prescriptions  Medication Sig Dispense Refill  . BYSTOLIC 10 MG tablet Take 10 mg by mouth daily.  1  . sitaGLIPtin (JANUVIA) 100 MG tablet Take 100 mg by mouth daily.     No current facility-administered medications for this visit.    Allergies as of 03/30/2015  . (No Known  Allergies)    Vitals: BP 135/87 mmHg  Pulse 75  Temp(Src) 97.7 F (36.5 C)  Ht 6' (1.829 m)  Wt 242 lb 6.4 oz (109.952 kg)  BMI 32.87 kg/m2 Last Weight:  Wt Readings from Last 1 Encounters:  03/30/15 242 lb 6.4 oz (109.952 kg)   Last Height:   Ht Readings from Last 1 Encounters:  03/30/15 6' (1.829 m)   Physical exam: Exam: Gen: NAD, conversant, well nourised, overweight, well groomed                     CV: RRR, no MRG. No Carotid Bruits. No peripheral edema, warm, nontender Eyes: Conjunctivae clear without exudates or hemorrhage  Neuro: Detailed Neurologic Exam  Speech:    Speech is normal; fluent and spontaneous with normal comprehension.  Cognition:    The patient is oriented to person, place, and time;     recent and remote memory intact;     language  fluent;     normal attention, concentration,     fund of knowledge Cranial Nerves:    The pupils are equal, round, and reactive to light. The fundi are normal and spontaneous venous pulsations are present. Visual fields are full to finger confrontation. Extraocular movements are intact. Trigeminal sensation is intact and the muscles of mastication are normal. The face is symmetric. The palate elevates in the midline. Hearing intact. Voice is normal. Shoulder shrug is normal. The tongue has normal motion without fasciculations.   Coordination:    High frequency low amplitude head tremor which increases with concentration, not distractable  Gait:    Heel-toe and tandem gait are normal.   Motor Observation:    No asymmetry, no atrophy, and no involuntary movements noted. Tone:    Normal muscle tone.    Posture:    Posture is normal. normal erect    Strength:    Strength is V/V in the upper and lower limbs.      Sensation: intact to LT     Reflex Exam:  DTR's:    Deep tendon reflexes in the upper and lower extremities are normal bilaterally.   Toes:    The toes are downgoing bilaterally.   Clonus:    Clonus is absent.     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  - will check copper, ceruloplasmin, heavy metals (TSH WNL) - Caffeine abuse: asked patient to stop caffeine for a week and evaluate tremor - he will pay more attention next time when he has alcohol to see if tremor improved (essential tremor).  - pheochromocytoma may need to be ruled out in patients with enhanced physiologic tremor, will refer back to PCP for this testing   Naomie DeanAntonia Ahern, MD  [facility] [address] [place], [place] [address]  Phone [phone] Fax [phone]

## 2015-03-31 ENCOUNTER — Telehealth: Payer: Self-pay | Admitting: Neurology

## 2015-03-31 NOTE — Telephone Encounter (Signed)
Spoke to patient regarding abnormal LFTs and faxed values to his pcp, Dr. Jonnie KindVeita Blamd.

## 2015-04-01 LAB — THYROID PANEL WITH TSH
Free Thyroxine Index: 2.4 (ref 1.2–4.9)
T3 UPTAKE RATIO: 32 % (ref 24–39)
T4 TOTAL: 7.6 ug/dL (ref 4.5–12.0)
TSH: 1.76 u[IU]/mL (ref 0.450–4.500)

## 2015-04-01 LAB — COMPREHENSIVE METABOLIC PANEL
ALT: 276 IU/L — ABNORMAL HIGH (ref 0–44)
AST: 656 IU/L (ref 0–40)
Albumin/Globulin Ratio: 1.7 (ref 1.1–2.5)
Albumin: 4.3 g/dL (ref 3.5–5.5)
Alkaline Phosphatase: 86 IU/L (ref 39–117)
BUN/Creatinine Ratio: 20 (ref 9–20)
BUN: 15 mg/dL (ref 6–24)
Bilirubin Total: <0.2 mg/dL (ref 0.0–1.2)
CALCIUM: 9.2 mg/dL (ref 8.7–10.2)
CO2: 22 mmol/L (ref 18–29)
Chloride: 103 mmol/L (ref 97–108)
Creatinine, Ser: 0.76 mg/dL (ref 0.76–1.27)
GFR calc Af Amer: 131 mL/min/{1.73_m2} (ref >59)
GFR calc non Af Amer: 113 mL/min/{1.73_m2} (ref >59)
GLOBULIN, TOTAL: 2.5 g/dL (ref 1.5–4.5)
Glucose: 93 mg/dL (ref 65–99)
POTASSIUM: 4.2 mmol/L (ref 3.5–5.2)
SODIUM: 141 mmol/L (ref 134–144)
Total Protein: 6.8 g/dL (ref 6.0–8.5)

## 2015-04-01 LAB — CERULOPLASMIN: Ceruloplasmin: 32.3 mg/dL — ABNORMAL HIGH (ref 16.0–31.0)

## 2015-04-01 LAB — HEAVY METALS, BLOOD
Arsenic: 4 ug/L (ref 2–23)
LEAD, BLOOD: 1 ug/dL (ref 0–19)
MERCURY: NOT DETECTED ug/L (ref 0.0–14.9)

## 2015-04-01 LAB — COPPER, SERUM: COPPER: 141 ug/dL (ref 72–166)

## 2015-05-11 ENCOUNTER — Telehealth: Payer: Self-pay | Admitting: *Deleted

## 2015-05-11 ENCOUNTER — Ambulatory Visit (INDEPENDENT_AMBULATORY_CARE_PROVIDER_SITE_OTHER): Payer: BLUE CROSS/BLUE SHIELD | Admitting: Neurology

## 2015-05-11 ENCOUNTER — Encounter: Payer: Self-pay | Admitting: Neurology

## 2015-05-11 VITALS — BP 142/91 | HR 73 | Ht 72.0 in | Wt 234.6 lb

## 2015-05-11 DIAGNOSIS — R740 Nonspecific elevation of levels of transaminase and lactic acid dehydrogenase [LDH]: Secondary | ICD-10-CM | POA: Diagnosis not present

## 2015-05-11 DIAGNOSIS — R7401 Elevation of levels of liver transaminase levels: Secondary | ICD-10-CM | POA: Insufficient documentation

## 2015-05-11 NOTE — Patient Instructions (Addendum)
Appt with Dr. Parke SimmersBland: 05/13/2015 at 11am  I would like to see you back as needed, sooner if we need to. Please call us with any interim questions, concerns, problems, updates or refill requests.   Our phone number is [phone]. We also have an after hours call service for urgent matters and there is a physician on-call for urgent questions. For any emergencies you know to call 911 or go to the nearest emergency room

## 2015-05-11 NOTE — Telephone Encounter (Signed)
Larita FifeLynn picked up phone from Dr. Parke SimmersBland office and put me on hold. Told them pt tried calling last month to set up appt to f/u about critical liver lab results. AST was 656 and ALT was 276. Pt stated he did not hear back from them to set up appt. I then spoke with Larita FifeLynn about critical lab values. She stated pt has appt on 06/09/15. I asked if he could be seen sooner. She stated he can be seen Friday, 05/13/15 at 11:00am. I told her I will fax over results to [phone]. She verbalized understanding.    Faxed over ALT/AST results to Dr. Parke SimmersBland at 11:14am. Also faxed over other labs done. Received fax confirmation. Pt aware.

## 2015-05-11 NOTE — Progress Notes (Signed)
[facility]    Provider: Dr Lucia GaskinsAhern Referring Provider: Renaye RakersBland, Veita, MD Primary Care Physician: Geraldo PitterBLAND,VEITA J, MD  CC: Facial tremors  Interval update: He decreased the caffeine and the tremor is improved. No tremor today. He did not follow up with his pcp as we highly encouraged for elevated ast AND ALT.   Initial visit 03/30/2015: Crissie SicklesJoe L Wescoat is a 39 y.o. male here as a referral from Dr. Parke SimmersBland for tremors. PMHx HTN, HgbA1c, HLD. Started years ago. 6 years ago. Getting worse. Can't control it. It happens when he is trying to relax. Not positional. He doesn't notice it significantly, other people tell him about it. Doesn't bother him at all. Doesn't know if it happens when sleeping. He does notice it sometimes and feels it is more frequent. No tremors in the hands or legs. He drinks 2 Liters of soda and lots of tea tea, green tea with caffeine he drinks a lot of it. No stiff neck or decreased ROM. Tremor happens more when he is trying to focus. No family tremors as far as he knows. Unknown if alcohol helps. No headaches. No blurry vision.   Reviewed notes, labs and imaging from outside physicians, which showed: Patient was seen in the ED a few months agowith a 7 out of 10 right sided headache. At that time patient stated that the headache had been ongoing for 4 weeks. He had been to his primary care for the headache. At that time he says the pain was too great and he needed pain treatment. He describes the headache as pressure on the right side behind the eyes, he was given a beta blocker without improvement by his primary care. He reported photophobia but no nausea or vomiting, no visual changes no slurred speech, no numbness, no tingling, no weakness, no fevers chills or neck pain. Patient has had similar headaches in the past per report. The emergency room possibly patient had cluster headaches.  Review of Systems: Patient complains of symptoms per HPI as well as the  following symptoms: no CP, no SOB. Pertinent negatives per HPI. All others negative.   History   Social History  . Marital Status: Married    Spouse Name: Lamar LaundrySonya  . Number of Children: 8  . Years of Education: 9   Occupational History  . General Manger    Social History Main Topics  . Smoking status: Current Some Day Smoker -- 1.00 packs/day    Types: Cigarettes  . Smokeless tobacco: Not on file  . Alcohol Use: 0.0 oz/week    0 Standard drinks or equivalent per week     Comment: [place]  . Drug Use: No  . Sexual Activity: Not on file   Other Topics Concern  . Not on file   Social History Narrative   Lives at home with his wife and kids.   Caffeine use: Drinks 2 liters of soda per day.     Family History  Problem Relation Age of Onset  . Heart Problems Mother   . Parkinsonism Neg Hx   . Seizures Neg Hx     Past Medical History  Diagnosis Date  . Hypertension   . Headache     Past Surgical History  Procedure Laterality Date  . Wrist surgery      Right    Current Outpatient Prescriptions  Medication Sig Dispense Refill  . BYSTOLIC 10 MG tablet Take 10 mg by mouth daily.  1  . sitaGLIPtin (JANUVIA) 100 MG tablet  Take 100 mg by mouth daily.     No current facility-administered medications for this visit.    Allergies as of 05/11/2015  . (No Known Allergies)    Vitals: There were no vitals taken for this visit. Last Weight:  Wt Readings from Last 1 Encounters:  03/30/15 242 lb 6.4 oz (109.952 kg)   Last Height:   Ht Readings from Last 1 Encounters:  03/30/15 6' (1.829 m)    Exam: Gen: NAD, conversant, well nourised, overweight, well groomed  CV: RRR, no MRG. No Carotid Bruits. No peripheral edema, warm, nontender Eyes: Conjunctivae clear without exudates or hemorrhage  Neuro: Detailed Neurologic Exam  Speech:  Speech is normal; fluent and spontaneous with normal comprehension.  Cognition:  The patient is oriented to  person, place, and time;   recent and remote memory intact;   language fluent;   normal attention, concentration,   fund of knowledge Cranial Nerves:  The pupils are equal, round, and reactive to light.  Visual fields are full to finger confrontation. Extraocular movements are intact. Trigeminal sensation is intact and the muscles of mastication are normal. The face is symmetric. The palate elevates in the midline. Hearing intact. Voice is normal. Shoulder shrug is normal. The tongue has normal motion without fasciculations.   Gait:  Heel-toe and tandem gait are normal.   Motor Observation:  No tremor Tone:  Normal muscle tone.   Posture:  Posture is normal. normal erect   Strength:  Strength is V/V in the upper and lower limbs.    Sensation: intact to LT   Reflex Exam:  DTR's:  Deep tendon reflexes in the upper and lower extremities are normal bilaterally.  Toes:  The toes are downgoing bilaterally.  Clonus:  Clonus is absent.     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 Resolved with stopping caffeine. Transaminitis on labs. He did not follow up with his pcp as we encouraged, we called them today and scheduled him an appointment for Friday with his pcp. Urged him not to miss his appointment. Discussed the significance with him.  Naomie Dean, MD  [facility] [address] [place], [place] [address]  Phone [phone] Fax [phone]  A total of 15 minutes was spent face-to-face with this patient. Over half this time was spent on counseling patient on the tremor, transaminitis diagnosis and different diagnostic and therapeutic options available.

## 2015-05-11 NOTE — Telephone Encounter (Signed)
Thank you :)

## 2015-10-13 ENCOUNTER — Other Ambulatory Visit (HOSPITAL_COMMUNITY)
Admission: RE | Admit: 2015-10-13 | Discharge: 2015-10-13 | Disposition: A | Discharge status: Home / Self Care | Payer: BLUE CROSS/BLUE SHIELD | Source: Ambulatory Visit | Attending: Family Medicine | Admitting: Family Medicine

## 2015-10-13 DIAGNOSIS — Z113 Encounter for screening for infections with a predominantly sexual mode of transmission: Secondary | ICD-10-CM | POA: Diagnosis not present

## 2017-03-27 ENCOUNTER — Encounter (HOSPITAL_COMMUNITY): Payer: Self-pay | Admitting: Family Medicine

## 2017-03-27 ENCOUNTER — Ambulatory Visit (HOSPITAL_COMMUNITY)
Admission: EM | Admit: 2017-03-27 | Discharge: 2017-03-27 | Disposition: A | Discharge status: Home / Self Care | Payer: BLUE CROSS/BLUE SHIELD | Attending: Family Medicine | Admitting: Family Medicine

## 2017-03-27 DIAGNOSIS — M659 Synovitis and tenosynovitis, unspecified: Secondary | ICD-10-CM

## 2017-03-27 MED ORDER — NAPROXEN 500 MG PO TABS: 500.0000 mg | ORAL_TABLET | Freq: Two times a day (BID) | ORAL | 0 refills | Status: AC

## 2017-03-27 NOTE — ED Provider Notes (Signed)
CSN: [id number]     Arrival date & time 03/27/17  1002 History   None    Chief Complaint  Patient presents with  . Wrist Pain   (Consider location/radiation/quality/duration/timing/severity/associated sxs/prior Treatment) Patient c/o left wrist pain for 2 weeks.  He has to work with his hands a lot.   The history is provided by the patient.  Wrist Pain  This is a new problem. The problem occurs constantly. The problem has not changed since onset.Nothing aggravates the symptoms. Nothing relieves the symptoms. He has tried nothing for the symptoms.    Past Medical History:  Diagnosis Date  . Headache   . Hypertension    Past Surgical History:  Procedure Laterality Date  . WRIST SURGERY     Right   Family History  Problem Relation Age of Onset  . Heart Problems Mother   . Parkinsonism Neg Hx   . Seizures Neg Hx    Social History  Substance Use Topics  . Smoking status: Current Some Day Smoker    Packs/day: 1.00    Types: Cigarettes  . Smokeless tobacco: Never Used  . Alcohol use 0.0 oz/week     Comment: [place]cass    Review of Systems  Constitutional: Negative.   HENT: Negative.   Eyes: Negative.   Respiratory: Negative.   Cardiovascular: Negative.   Gastrointestinal: Negative.   Endocrine: Negative.   Genitourinary: Negative.   Musculoskeletal: Positive for arthralgias.  Allergic/Immunologic: Negative.   Neurological: Negative.   Hematological: Negative.   Psychiatric/Behavioral: Negative.     Allergies  Patient has no known allergies.  Home Medications   Prior to Admission medications   Medication Sig Start Date End Date Taking? Authorizing Provider  BYSTOLIC 10 MG tablet Take 10 mg by mouth daily. 03/14/15   [provider]  naproxen (NAPROSYN) 500 MG tablet Take 1 tablet (500 mg total) by mouth 2 (two) times daily with a meal. 03/27/17   Caddie Randle, Anselm PancoastWilliam J, FNP  sitaGLIPtin (JANUVIA) 100 MG tablet Take 100 mg by mouth daily.    [provider]   Meds Ordered and Administered this Visit  Medications - No data to display  BP (!) 158/82   Pulse 63   Temp 98.6 F (37 C)   Resp 18   SpO2 96%  No data found.   Physical Exam  Constitutional: He appears well-developed and well-nourished.  HENT:  Head: Normocephalic and atraumatic.  Eyes: Conjunctivae and EOM are normal. Pupils are equal, round, and reactive to light.  Neck: Normal range of motion. Neck supple.  Cardiovascular: Normal rate, regular rhythm and normal heart sounds.   Pulmonary/Chest: Effort normal and breath sounds normal.  Musculoskeletal: He exhibits tenderness.  Positive Finklestein left wrist    Nursing note and vitals reviewed.   Urgent Care Course     Procedures (including critical care time)  Labs Review Labs Reviewed - No data to display  Imaging Review No results found.   Visual Acuity Review  Right Eye Distance:   Left Eye Distance:   Bilateral Distance:    Right Eye Near:   Left Eye Near:    Bilateral Near:         MDM   1. Tenosynovitis of left wrist    Naprosyn 500mg  one po bid x 10 days #20 Left thumb spica splint      Deatra CanterOxford, Ariona Deschene J, FNP 03/27/17 1032

## 2017-03-27 NOTE — ED Triage Notes (Signed)
Pt here for left wrist pain x 2 weeks. Denies injury. sts that the pain is constant.

## 2017-03-28 ENCOUNTER — Encounter: Payer: Self-pay | Admitting: Emergency Medicine

## 2017-03-28 ENCOUNTER — Ambulatory Visit (INDEPENDENT_AMBULATORY_CARE_PROVIDER_SITE_OTHER): Payer: BLUE CROSS/BLUE SHIELD | Admitting: Emergency Medicine

## 2017-03-28 VITALS — BP 163/93 | HR 47 | Temp 98.0°F | Resp 18 | Ht 71.0 in | Wt 212.4 lb

## 2017-03-28 DIAGNOSIS — I10 Essential (primary) hypertension: Secondary | ICD-10-CM | POA: Diagnosis not present

## 2017-03-28 DIAGNOSIS — Z8639 Personal history of other endocrine, nutritional and metabolic disease: Secondary | ICD-10-CM | POA: Diagnosis not present

## 2017-03-28 DIAGNOSIS — I4949 Other premature depolarization: Secondary | ICD-10-CM

## 2017-03-28 LAB — CBC WITH DIFFERENTIAL/PLATELET
BASOS ABS: 0 10*3/uL (ref 0.0–0.2)
Basos: 0 %
EOS (ABSOLUTE): 0.1 10*3/uL (ref 0.0–0.4)
Eos: 2 %
Hematocrit: 42.2 % (ref 37.5–51.0)
Hemoglobin: 13.7 g/dL (ref 13.0–17.7)
Immature Grans (Abs): 0 10*3/uL (ref 0.0–0.1)
Immature Granulocytes: 0 %
Lymphocytes Absolute: 3.5 10*3/uL — ABNORMAL HIGH (ref 0.7–3.1)
Lymphs: 50 %
MCH: 30.2 pg (ref 26.6–33.0)
MCHC: 32.5 g/dL (ref 31.5–35.7)
MCV: 93 fL (ref 79–97)
Monocytes Absolute: 0.6 10*3/uL (ref 0.1–0.9)
Monocytes: 8 %
NEUTROS ABS: 2.8 10*3/uL (ref 1.4–7.0)
Neutrophils: 40 %
Platelets: 266 10*3/uL (ref 150–379)
RBC: 4.53 x10E6/uL (ref 4.14–5.80)
RDW: 14.6 % (ref 12.3–15.4)
WBC: 7.1 10*3/uL (ref 3.4–10.8)

## 2017-03-28 LAB — COMPREHENSIVE METABOLIC PANEL
A/G RATIO: 1.5 (ref 1.2–2.2)
ALK PHOS: 83 IU/L (ref 39–117)
ALT: 23 IU/L (ref 0–44)
AST: 19 IU/L (ref 0–40)
Albumin: 4.2 g/dL (ref 3.5–5.5)
BILIRUBIN TOTAL: 0.4 mg/dL (ref 0.0–1.2)
BUN / CREAT RATIO: 16 (ref 9–20)
BUN: 17 mg/dL (ref 6–24)
CHLORIDE: 103 mmol/L (ref 96–106)
CO2: 21 mmol/L (ref 18–29)
Calcium: 9.2 mg/dL (ref 8.7–10.2)
Creatinine, Ser: 1.07 mg/dL (ref 0.76–1.27)
GFR calc Af Amer: 98 mL/min/{1.73_m2} (ref >59)
GFR calc non Af Amer: 85 mL/min/{1.73_m2} (ref >59)
Globulin, Total: 2.8 g/dL (ref 1.5–4.5)
Glucose: 84 mg/dL (ref 65–99)
Potassium: 4.4 mmol/L (ref 3.5–5.2)
Sodium: 138 mmol/L (ref 134–144)
TOTAL PROTEIN: 7 g/dL (ref 6.0–8.5)

## 2017-03-28 LAB — POCT GLYCOSYLATED HEMOGLOBIN (HGB A1C): HEMOGLOBIN A1C: 5.5

## 2017-03-28 MED ORDER — AMLODIPINE BESYLATE 5 MG PO TABS: 5.0000 mg | ORAL_TABLET | Freq: Every day | ORAL | 3 refills | Status: AC

## 2017-03-28 NOTE — Patient Instructions (Addendum)
Hypertension Hypertension is another name for high blood pressure. High blood pressure forces your heart to work harder to pump blood. This can cause problems over time. There are two numbers in a blood pressure reading. There is a top number (systolic) over a bottom number (diastolic). It is best to have a blood pressure below 120/80. Healthy choices can help lower your blood pressure. You may need medicine to help lower your blood pressure if:  Your blood pressure cannot be lowered with healthy choices.  Your blood pressure is higher than 130/80. Follow these instructions at home: Eating and drinking   If directed, follow the DASH eating plan. This diet includes:  Filling half of your plate at each meal with fruits and vegetables.  Filling one quarter of your plate at each meal with whole grains. Whole grains include whole wheat pasta, brown rice, and whole grain bread.  Eating or drinking low-fat dairy products, such as skim milk or low-fat yogurt.  Filling one quarter of your plate at each meal with low-fat (lean) proteins. Low-fat proteins include fish, skinless chicken, eggs, beans, and tofu.  Avoiding fatty meat, cured and processed meat, or chicken with skin.  Avoiding premade or processed food.  Eat less than 1,500 mg of salt (sodium) a day.  Limit alcohol use to no more than 1 drink a day for nonpregnant women and 2 drinks a day for men. One drink equals 12 oz of beer, 5 oz of wine, or 1 oz of hard liquor. Lifestyle   Work with your doctor to stay at a healthy weight or to lose weight. Ask your doctor what the best weight is for you.  Get at least 30 minutes of exercise that causes your heart to beat faster (aerobic exercise) most days of the week. This may include walking, swimming, or biking.  Get at least 30 minutes of exercise that strengthens your muscles (resistance exercise) at least 3 days a week. This may include lifting weights or pilates.  Do not use any  products that contain nicotine or tobacco. This includes cigarettes and e-cigarettes. If you need help quitting, ask your doctor.  Check your blood pressure at home as told by your doctor.  Keep all follow-up visits as told by your doctor. This is important. Medicines   Take over-the-counter and prescription medicines only as told by your doctor. Follow directions carefully.  Do not skip doses of blood pressure medicine. The medicine does not work as well if you skip doses. Skipping doses also puts you at risk for problems.  Ask your doctor about side effects or reactions to medicines that you should watch for. Contact a doctor if:  You think you are having a reaction to the medicine you are taking.  You have headaches that keep coming back (recurring).  You feel dizzy.  You have swelling in your ankles.  You have trouble with your vision. Get help right away if:  You get a very bad headache.  You start to feel confused.  You feel weak or numb.  You feel faint.  You get very bad pain in your:  Chest.  Belly (abdomen).  You throw up (vomit) more than once.  You have trouble breathing. Summary  Hypertension is another name for high blood pressure.  Making healthy choices can help lower blood pressure. If your blood pressure cannot be controlled with healthy choices, you may need to take medicine. This information is not intended to replace advice given to you by  your health care provider. Make sure you discuss any questions you have with your health care provider. Document Released: 04/16/2008 Document Revised: 09/26/2016 Document Reviewed: 09/26/2016 Elsevier Interactive Patient Education  2017 [employer].    IF you received an x-ray today, you will receive an invoice from [facility]. Please contact [facility] at [phone] with questions or concerns regarding your invoice.   IF you received labwork today, you will receive an invoice from  [facility]. Please contact [facility] at [phone] with questions or concerns regarding your invoice.   Our billing staff will not be able to assist you with questions regarding bills from these companies.  You will be contacted with the lab results as soon as they are available. The fastest way to get your results is to activate your My Chart account. Instructions are located on the last page of this paperwork. If you have not heard from [place] regarding the results in 2 weeks, please contact this office.

## 2017-03-28 NOTE — Progress Notes (Signed)
Dylan Mccullough 41 y.o.   Chief Complaint  Patient presents with  . Annual Exam    HISTORY OF PRESENT ILLNESS: This is a 41 y.o. male here for annual exam; has h/o HTN; no meds for over a year; working out and losing weight. Asymptomatic.  HPI   Prior to Admission medications   Medication Sig Start Date End Date Taking? Authorizing Provider  BYSTOLIC 10 MG tablet Take 10 mg by mouth daily. 03/14/15   [provider]  naproxen (NAPROSYN) 500 MG tablet Take 1 tablet (500 mg total) by mouth 2 (two) times daily with a meal. Patient not taking: Reported on 03/28/2017 03/27/17   Deatra Canter, FNP  sitaGLIPtin (JANUVIA) 100 MG tablet Take 100 mg by mouth daily.    [provider]    No Known Allergies  Patient Active Problem List   Diagnosis Date Noted  . Transaminitis 05/11/2015  . Tremor 03/30/2015  . 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 HTN (hypertension) 03/30/2015  . HLD (hyperlipidemia) 03/30/2015  . Diabetes (HCC) 03/30/2015    Past Medical History:  Diagnosis Date  . Headache   . Hypertension     Past Surgical History:  Procedure Laterality Date  . WRIST SURGERY     Right    Social History   Social History  . Marital status: Married    Spouse name: Lamar Laundry  . Number of children: 8  . Years of education: 9   Occupational History  . General Manger    Social History Main Topics  . Smoking status: Former Smoker    Packs/day: 1.00    Types: Cigarettes  . Smokeless tobacco: Never Used  . Alcohol use 0.0 oz/week     Comment: [place]  . Drug use: No  . Sexual activity: Not on file   Other Topics Concern  . Not on file   Social History Narrative   Lives at home with his wife and kids.   Caffeine use: Drinks 2 liters of soda per day.     Family History  Problem Relation Age of Onset  . Heart Problems Mother   . Stroke Mother   . Hypertension Mother   . Hyperlipidemia Mother   . Parkinsonism Neg Hx   . Seizures Neg Hx    Results  for orders placed or performed in visit on 03/28/17 (from the past 24 hour(s))  POCT glycosylated hemoglobin (Hb A1C)     Status: None   Collection Time: 03/28/17  9:35 AM  Result Value Ref Range   Hemoglobin A1C 5.5      Review of Systems  Constitutional: Positive for weight loss (on purpose). Negative for chills and fever.  HENT: Negative.  Negative for congestion, nosebleeds and sore throat.   Eyes: Negative.  Negative for blurred vision and double vision.  Respiratory: Negative.  Negative for cough and shortness of breath.   Cardiovascular: Negative.  Negative for chest pain, palpitations, claudication and leg swelling.  Gastrointestinal: Negative.  Negative for abdominal pain, diarrhea, heartburn, nausea and vomiting.  Genitourinary: Negative.  Negative for dysuria and hematuria.  Musculoskeletal: Negative.  Negative for myalgias and neck pain.  Skin: Negative.  Negative for rash.  Neurological: Negative.  Negative for dizziness, sensory change, focal weakness, seizures and headaches.  Endo/Heme/Allergies: Negative.   All other systems reviewed and are negative.  EKG: NSR with PAC's and PVC's; no acute ischemic changes.  Physical Exam  Constitutional: He is oriented to person, place, and time. He appears  well-developed and well-nourished.  HENT:  Head: Normocephalic and atraumatic.  Nose: Nose normal.  Mouth/Throat: Oropharynx is clear and moist. No oropharyngeal exudate.  Eyes: Conjunctivae and EOM are normal. Pupils are equal, round, and reactive to light.  Neck: Normal range of motion. Neck supple. No JVD present. No thyromegaly present.  Cardiovascular: Normal rate, regular rhythm and normal heart sounds.   Pulmonary/Chest: Effort normal and breath sounds normal.  Abdominal: Soft. Bowel sounds are normal. He exhibits no distension. There is no tenderness.  Musculoskeletal: Normal range of motion.  Lymphadenopathy:    He has no cervical adenopathy.  Neurological: He is  alert and oriented to person, place, and time. He displays normal reflexes. No cranial nerve deficit or sensory deficit. He exhibits normal muscle tone. Coordination normal.  Skin: Skin is warm and dry. Capillary refill takes less than 2 seconds. No rash noted.  Psychiatric: He has a normal mood and affect. His behavior is normal.  Vitals reviewed.  Vitals:   03/28/17 0819  BP: (!) 163/93  Pulse: (!) 47  Resp: 18  Temp: 98 F (36.7 C)     ASSESSMENT & PLAN: Dylan Mccullough was seen today for annual exam.  Diagnoses and all orders for this visit:  Essential hypertension -     EKG 12-Lead -     CBC with Differential/Platelet -     Comprehensive metabolic panel  H/O type 2 diabetes mellitus -     POCT glycosylated hemoglobin (Hb A1C)  Extrasystoles  Other orders -     amLODipine (NORVASC) 5 MG tablet; Take 1 tablet (5 mg total) by mouth daily.    Patient Instructions     Hypertension Hypertension is another name for high blood pressure. High blood pressure forces your heart to work harder to pump blood. This can cause problems over time. There are two numbers in a blood pressure reading. There is a top number (systolic) over a bottom number (diastolic). It is best to have a blood pressure below 120/80. Healthy choices can help lower your blood pressure. You may need medicine to help lower your blood pressure if:  Your blood pressure cannot be lowered with healthy choices.  Your blood pressure is higher than 130/80. Follow these instructions at home: Eating and drinking   If directed, follow the DASH eating plan. This diet includes:  Filling half of your plate at each meal with fruits and vegetables.  Filling one quarter of your plate at each meal with whole grains. Whole grains include whole wheat pasta, brown rice, and whole grain bread.  Eating or drinking low-fat dairy products, such as skim milk or low-fat yogurt.  Filling one quarter of your plate at each meal with  low-fat (lean) proteins. Low-fat proteins include fish, skinless chicken, eggs, beans, and tofu.  Avoiding fatty meat, cured and processed meat, or chicken with skin.  Avoiding premade or processed food.  Eat less than 1,500 mg of salt (sodium) a day.  Limit alcohol use to no more than 1 drink a day for nonpregnant women and 2 drinks a day for men. One drink equals 12 oz of beer, 5 oz of wine, or 1 oz of hard liquor. Lifestyle   Work with your doctor to stay at a healthy weight or to lose weight. Ask your doctor what the best weight is for you.  Get at least 30 minutes of exercise that causes your heart to beat faster (aerobic exercise) most days of the week. This may include walking, swimming,  or biking.  Get at least 30 minutes of exercise that strengthens your muscles (resistance exercise) at least 3 days a week. This may include lifting weights or pilates.  Do not use any products that contain nicotine or tobacco. This includes cigarettes and e-cigarettes. If you need help quitting, ask your doctor.  Check your blood pressure at home as told by your doctor.  Keep all follow-up visits as told by your doctor. This is important. Medicines   Take over-the-counter and prescription medicines only as told by your doctor. Follow directions carefully.  Do not skip doses of blood pressure medicine. The medicine does not work as well if you skip doses. Skipping doses also puts you at risk for problems.  Ask your doctor about side effects or reactions to medicines that you should watch for. Contact a doctor if:  You think you are having a reaction to the medicine you are taking.  You have headaches that keep coming back (recurring).  You feel dizzy.  You have swelling in your ankles.  You have trouble with your vision. Get help right away if:  You get a very bad headache.  You start to feel confused.  You feel weak or numb.  You feel faint.  You get very bad pain in  your:  Chest.  Belly (abdomen).  You throw up (vomit) more than once.  You have trouble breathing. Summary  Hypertension is another name for high blood pressure.  Making healthy choices can help lower blood pressure. If your blood pressure cannot be controlled with healthy choices, you may need to take medicine. This information is not intended to replace advice given to you by your health care provider. Make sure you discuss any questions you have with your health care provider. Document Released: 04/16/2008 Document Revised: 09/26/2016 Document Reviewed: 09/26/2016 Elsevier Interactive Patient Education  2017 [employer].    IF you received an x-ray today, you will receive an invoice from [facility]iology. Please contact [facility] at [phone] with questions or concerns regarding your invoice.   IF you received labwork today, you will receive an invoice from [facility]. Please contact [facility] at [phone] with questions or concerns regarding your invoice.   Our billing staff will not be able to assist you with questions regarding bills from these companies.  You will be contacted with the lab results as soon as they are available. The fastest way to get your results is to activate your My Chart account. Instructions are located on the last page of this paperwork. If you have not heard from [place] regarding the results in 2 weeks, please contact this office.          Edwina Barth, MD [facility]

## 2019-09-14 DIAGNOSIS — Z87891 Personal history of nicotine dependence: Secondary | ICD-10-CM | POA: Diagnosis not present

## 2019-09-14 DIAGNOSIS — R030 Elevated blood-pressure reading, without diagnosis of hypertension: Secondary | ICD-10-CM | POA: Diagnosis not present

## 2019-09-14 DIAGNOSIS — Z13220 Encounter for screening for lipoid disorders: Secondary | ICD-10-CM | POA: Diagnosis not present

## 2019-09-14 DIAGNOSIS — Z9189 Other specified personal risk factors, not elsewhere classified: Secondary | ICD-10-CM | POA: Diagnosis not present

## 2019-09-14 DIAGNOSIS — Z23 Encounter for immunization: Secondary | ICD-10-CM | POA: Diagnosis not present

## 2019-09-14 DIAGNOSIS — Z8249 Family history of ischemic heart disease and other diseases of the circulatory system: Secondary | ICD-10-CM | POA: Diagnosis not present

## 2019-09-14 DIAGNOSIS — Z0000 Encounter for general adult medical examination without abnormal findings: Secondary | ICD-10-CM | POA: Diagnosis not present

## 2019-10-05 DIAGNOSIS — K625 Hemorrhage of anus and rectum: Secondary | ICD-10-CM | POA: Diagnosis not present

## 2019-12-15 DIAGNOSIS — E782 Mixed hyperlipidemia: Secondary | ICD-10-CM | POA: Diagnosis not present

## 2019-12-15 DIAGNOSIS — Z1159 Encounter for screening for other viral diseases: Secondary | ICD-10-CM | POA: Diagnosis not present

## 2019-12-15 DIAGNOSIS — Z8349 Family history of other endocrine, nutritional and metabolic diseases: Secondary | ICD-10-CM | POA: Diagnosis not present

## 2019-12-18 DIAGNOSIS — K648 Other hemorrhoids: Secondary | ICD-10-CM | POA: Diagnosis not present

## 2019-12-18 DIAGNOSIS — K625 Hemorrhage of anus and rectum: Secondary | ICD-10-CM | POA: Diagnosis not present

## 2022-12-31 ENCOUNTER — Emergency Department (HOSPITAL_COMMUNITY): Payer: 59

## 2022-12-31 ENCOUNTER — Emergency Department (HOSPITAL_COMMUNITY)
Admission: EM | Admit: 2022-12-31 | Discharge: 2022-12-31 | Disposition: A | Discharge status: Home / Self Care | Payer: 59 | Attending: Emergency Medicine | Admitting: Emergency Medicine

## 2022-12-31 ENCOUNTER — Encounter (HOSPITAL_COMMUNITY): Payer: Self-pay

## 2022-12-31 ENCOUNTER — Other Ambulatory Visit: Payer: Self-pay

## 2022-12-31 DIAGNOSIS — M19031 Primary osteoarthritis, right wrist: Secondary | ICD-10-CM | POA: Diagnosis not present

## 2022-12-31 DIAGNOSIS — Z9889 Other specified postprocedural states: Secondary | ICD-10-CM | POA: Diagnosis not present

## 2022-12-31 DIAGNOSIS — S63591A Other specified sprain of right wrist, initial encounter: Secondary | ICD-10-CM | POA: Diagnosis not present

## 2022-12-31 DIAGNOSIS — Y990 Civilian activity done for income or pay: Secondary | ICD-10-CM | POA: Insufficient documentation

## 2022-12-31 DIAGNOSIS — S63501A Unspecified sprain of right wrist, initial encounter: Secondary | ICD-10-CM

## 2022-12-31 DIAGNOSIS — M7989 Other specified soft tissue disorders: Secondary | ICD-10-CM | POA: Diagnosis not present

## 2022-12-31 DIAGNOSIS — M25531 Pain in right wrist: Secondary | ICD-10-CM | POA: Diagnosis not present

## 2022-12-31 DIAGNOSIS — W19XXXA Unspecified fall, initial encounter: Secondary | ICD-10-CM | POA: Insufficient documentation

## 2022-12-31 MED ORDER — OXYCODONE HCL 5 MG PO TABS
5.0000 mg | ORAL_TABLET | Freq: Once | ORAL | Status: AC
  Administered 2022-12-31: 5 mg via ORAL
  Filled 2022-12-31: qty 1

## 2022-12-31 MED ORDER — IBUPROFEN 600 MG PO TABS: 600.0000 mg | ORAL_TABLET | Freq: Four times a day (QID) | ORAL | 0 refills | Status: AC | PRN

## 2022-12-31 MED ORDER — ACETAMINOPHEN 500 MG PO TABS: 500.0000 mg | ORAL_TABLET | Freq: Four times a day (QID) | ORAL | 0 refills | Status: AC | PRN

## 2022-12-31 NOTE — ED Triage Notes (Addendum)
Patient fell onto his right wrist at work. No LOC. Swelling to wrist. Previous break in the same wrist 14 years ago.

## 2022-12-31 NOTE — ED Provider Notes (Signed)
[place] EMERGENCY DEPARTMENT AT [facility] Provider Note   CSN: [id number] Arrival date & time: 12/31/22  1729     History  Chief Complaint  Patient presents with   Wrist Pain    Dylan Mccullough is a 47 y.o. male presenting today with right wrist pain.  Reports falling onto this wrist at work.  Reports injuring this wrist requiring surgery 14 years ago.  No numbness or tingling.  Still able to range his fingers but reports some difficulty flexing the wrist.   Wrist Pain       Home Medications Prior to Admission medications   Medication Sig Start Date End Date Taking? Authorizing Provider  amLODipine (NORVASC) 5 MG tablet Take 1 tablet (5 mg total) by mouth daily. 03/28/17   Horald Pollen, MD  BYSTOLIC 10 MG tablet Take 10 mg by mouth daily. 03/14/15   [provider]  naproxen (NAPROSYN) 500 MG tablet Take 1 tablet (500 mg total) by mouth 2 (two) times daily with a meal. Patient not taking: Reported on 03/28/2017 03/27/17   Lysbeth Penner, FNP  sitaGLIPtin (JANUVIA) 100 MG tablet Take 100 mg by mouth daily.    [provider]      Allergies    Patient has no known allergies.    Review of Systems   Review of Systems  Physical Exam Updated Vital Signs BP (!) 171/118   Pulse 100   Temp 98 F (36.7 C) (Oral)   Resp 19   SpO2 100%  Physical Exam Vitals and nursing note reviewed.  Constitutional:      Appearance: Normal appearance.  HENT:     Head: Normocephalic and atraumatic.  Eyes:     General: No scleral icterus.    Conjunctiva/sclera: Conjunctivae normal.  Pulmonary:     Effort: Pulmonary effort is normal. No respiratory distress.  Musculoskeletal:     Comments: Swelling and mild deformity to the wrist.  Strong radial pulse, normal cap refill in all digits.  Well-healed surgical scar to the volar surface of the wrist.  Full range of motion of all the digits however only able to mildly flex the wrist.  Difficulty with radial  deviation as well  Skin:    Findings: No rash.  Neurological:     Mental Status: He is alert.  Psychiatric:        Mood and Affect: Mood normal.     ED Results / Procedures / Treatments   Labs (all labs ordered are listed, but only abnormal results are displayed) Labs Reviewed - No data to display  EKG None  Radiology No results found.  Procedures Procedures   Medications Ordered in ED Medications  oxyCODONE (Oxy IR/ROXICODONE) immediate release tablet 5 mg (has no administration in time range)    ED Course/ Medical Decision Making/ A&P                             Medical Decision Making Amount and/or Complexity of Data Reviewed Radiology: ordered.  Risk Prescription drug management.   Male presenting today after falling onto his right wrist.  Differential includes but is not limited to fracture, dislocation, sprain.  Imaging: X-ray ordered, viewed and interpreted by me.  I agree with radiology that there are no acute findings, hardware in place from previous surgery.  Treatment: Given oxycodone and on reevaluation he reports improvement of his pain  MDM/disposition: 47 year old male presenting today with pain  to the right wrist after falling onto it at work.  X-ray negative  At this time is neurovascularly intact and stable for discharge home with a wrist brace.  Final Clinical Impression(s) / ED Diagnoses Final diagnoses:  Sprain of right wrist, initial encounter    Rx / DC Orders ED Discharge Orders          Ordered    ibuprofen (ADVIL) 600 MG tablet  Every 6 hours PRN        12/31/22 1809    acetaminophen (TYLENOL) 500 MG tablet  Every 6 hours PRN        12/31/22 1809           Results and diagnoses were explained to the patient. Return precautions discussed in full. Patient had no additional questions and expressed complete understanding.   This chart was dictated using voice recognition software.  Despite best efforts to proofread,   errors can occur which can change the documentation meaning.    Rhae Hammock, PA-C 12/31/22 1810    Audley Hose, MD 01/01/23 0001

## 2022-12-31 NOTE — Discharge Instructions (Addendum)
There are no broken bones in your wrist.  You may use ice at home and alternate ibuprofen and Tylenol for your pain.  I have sent prescription strength of both of these to your pharmacy.

## 2023-01-03 ENCOUNTER — Telehealth: Payer: Self-pay | Admitting: *Deleted

## 2023-01-03 ENCOUNTER — Encounter: Payer: Self-pay | Admitting: *Deleted

## 2023-01-03 NOTE — Transitions of Care (Post Inpatient/ED Visit) (Signed)
   01/03/2023  Name: Dylan Mccullough MRN: [id number] DOB: 1976-06-24  Today's TOC FU Call Status: Today's TOC FU Call Status:: Successful TOC FU Call Competed TOC FU Call Complete Date: 01/03/23  ED EMMI Red Alert notification from 01/02/23: "No scheduled follow up"  Transition Care Management Follow-up Telephone Call Date of Discharge: 12/31/22 Discharge Facility: [facility] Type of Discharge: Emergency Department Reason for ED Visit: Orthopedic Conditions Orthopedic/Injury Diagnosis: Sprain or Strain How have you been since you were released from the hospital?: Better Any questions or concerns?: No  Items Reviewed: Did you receive and understand the discharge instructions provided?: Yes (reviewed post- ED Visit instructions thoroughly with patient who verbalizes very good understanding of same) Medications obtained and verified?: Yes (Medications Reviewed) (declines full medication review today; confirms he obtained and is taking newly Rx'd medications post- recent ED visit on 12/31/22; self manages medications/ denies questions/ concerns aorund medications today) Any new allergies since your discharge?: No Dietary orders reviewed?: No Do you have support at home?: Yes People in Home: spouse Name of Support/Comfort Primary Source: reports he is indepebndent in self-care needs and is not having problems post-ED visit  Home Care and Equipment/Supplies: [place] Ordered?: NA Any new equipment or medical supplies ordered?: NA  Functional Questionnaire: Do you need assistance with bathing/showering or dressing?: No Do you need assistance with meal preparation?: No Do you need assistance with eating?: No Do you have difficulty maintaining continence: No Do you need assistance with getting out of bed/getting out of a chair/moving?: No Do you have difficulty managing or taking your medications?: No  Folllow up appointments reviewed: PCP Follow-up appointment  confirmed?: No MD Provider Line Number:[phone] Given: No (patient declined; reports he will "think about" getting new PCP or re-establishing with Dr. Franky Macho; he declines my offer of assistance in doing so today) Specialist Hospital Follow-up appointment confirmed?: No Reason Specialist Follow-Up Not Confirmed: Patient has Specialist Provider Number and will Call for Appointment Do you need transportation to your follow-up appointment?: No Do you understand care options if your condition(s) worsen?: Yes-patient verbalized understanding  SDOH Interventions Today    Flowsheet Row Most Recent Value  SDOH Interventions   Food Insecurity Interventions Intervention Not Indicated  Transportation Interventions Intervention Not Indicated  [drives self]      TOC Interventions Today    Flowsheet Row Most Recent Value  TOC Interventions   TOC Interventions Discussed/Reviewed TOC Interventions Discussed      Interventions Today    Flowsheet Row Most Recent Value  Chronic Disease   Chronic disease during today's visit Other  [(R) wrist sprain/ ED visit]  General Interventions   General Interventions Discussed/Reviewed General Interventions Discussed, Doctor Visits  Doctor Visits Discussed/Reviewed Doctor Visits Discussed, PCP, Specialist  Education Interventions   Education Provided Provided Education  Provided Verbal Education On When to see the doctor  [need for re-establishing with PCP provider]  Pharmacy Interventions   Pharmacy Dicussed/Reviewed Pharmacy Topics Discussed      Oneta Rack, RN, BSN, CCRN Alumnus RN CM Care Coordination/ Transition of [facility] Management [phone]: direct office
